# Patient Record
Sex: Female | Born: 1970 | Race: Black or African American | Hispanic: No | Marital: Single | Smoking: Current every day smoker
Health system: Southern US, Community
[De-identification: ages and names within clinical notes are randomized; demographics above are authoritative.]

## PROBLEM LIST (undated history)

## (undated) DIAGNOSIS — I10 Essential (primary) hypertension: Secondary | ICD-10-CM

## (undated) DIAGNOSIS — E7800 Pure hypercholesterolemia, unspecified: Secondary | ICD-10-CM

---

## 2018-05-13 ENCOUNTER — Encounter (HOSPITAL_COMMUNITY): Payer: Self-pay | Admitting: *Deleted

## 2018-05-13 ENCOUNTER — Other Ambulatory Visit: Payer: Self-pay

## 2018-05-13 ENCOUNTER — Emergency Department (HOSPITAL_COMMUNITY)
Admission: EM | Admit: 2018-05-13 | Discharge: 2018-05-13 | Disposition: A | Discharge status: Home / Self Care | Payer: Self-pay | Attending: Emergency Medicine | Admitting: Emergency Medicine

## 2018-05-13 DIAGNOSIS — Y999 Unspecified external cause status: Secondary | ICD-10-CM | POA: Insufficient documentation

## 2018-05-13 DIAGNOSIS — I10 Essential (primary) hypertension: Secondary | ICD-10-CM | POA: Insufficient documentation

## 2018-05-13 DIAGNOSIS — F17200 Nicotine dependence, unspecified, uncomplicated: Secondary | ICD-10-CM | POA: Insufficient documentation

## 2018-05-13 DIAGNOSIS — Y939 Activity, unspecified: Secondary | ICD-10-CM | POA: Insufficient documentation

## 2018-05-13 DIAGNOSIS — Y929 Unspecified place or not applicable: Secondary | ICD-10-CM | POA: Insufficient documentation

## 2018-05-13 DIAGNOSIS — T161XXA Foreign body in right ear, initial encounter: Secondary | ICD-10-CM | POA: Insufficient documentation

## 2018-05-13 DIAGNOSIS — W228XXA Striking against or struck by other objects, initial encounter: Secondary | ICD-10-CM | POA: Insufficient documentation

## 2018-05-13 HISTORY — DX: Pure hypercholesterolemia, unspecified: E78.00

## 2018-05-13 HISTORY — DX: Essential (primary) hypertension: I10

## 2018-05-13 NOTE — ED Triage Notes (Signed)
Pt reports cleaning her ears this am and cotton came off qtip in right ear.

## 2018-05-13 NOTE — ED Provider Notes (Signed)
[facility] Note   CSN: [id number] Arrival date & time: 05/13/18  1216     History   Chief Complaint Chief Complaint  Patient presents with  . Foreign Body in Ear    HPI Kristine Bradshaw is a 47 y.o. female who presents to the ED with c/o foreign body in the ear. Patient reports cleaning her ears with a Q-tip this morning and the cotton tip came off in her ear.   HPI  Past Medical History:  Diagnosis Date  . High cholesterol   . Hypertension     There are no active problems to display for this patient.   History reviewed. No pertinent surgical history.   OB History   None      Home Medications    Prior to Admission medications   Not on File    Family History History reviewed. No pertinent family history.  Social History Social History   Tobacco Use  . Smoking status: Current Every Day Smoker  Substance Use Topics  . Alcohol use: Yes    Comment: occ  . Drug use: Never     Allergies   Patient has no known allergies.   Review of Systems Review of Systems  HENT:       Foreign body right ear     Physical Exam Updated Vital Signs BP (!) 157/102 (BP Location: Right Arm)   Pulse 68   Temp 98.6 F (37 C) (Oral)   Resp 18   LMP 04/06/2018   SpO2 99%   Physical Exam  Constitutional: She appears well-developed and well-nourished. No distress.  HENT:  Head: Normocephalic.  Right Ear: A foreign body is present. Tympanic membrane is not perforated.  Eyes: EOM are normal.  Neck: Neck supple.  Cardiovascular: Normal rate.  Pulmonary/Chest: Effort normal.  Musculoskeletal: Normal range of motion.  Neurological: She is alert.  Skin: Skin is warm and dry.  Psychiatric: She has a normal mood and affect.  Nursing note and vitals reviewed.    ED Treatments / Results  Labs (all labs ordered are listed, but only abnormal results are displayed) Labs Reviewed - No data to display  Radiology No  results found.  Procedures .Foreign Body Removal Date/Time: 05/13/2018 12:47 PM Performed by: Janne NapoleonNeese, Erhardt Dada M, NP Authorized by: Janne NapoleonNeese, Cristie Mckinney M, NP  Consent: Verbal consent obtained. Risks and benefits: risks, benefits and alternatives were discussed Consent given by: patient Patient understanding: patient states understanding of the procedure being performed Required items: required blood products, implants, devices, and special equipment available Patient identity confirmed: verbally with patient Body area: ear Location details: right ear  Sedation: Patient sedated: no  Removal mechanism: alligator forceps Complexity: simple 1 objects recovered. Objects recovered: cotton Post-procedure assessment: foreign body removed Patient tolerance: Patient tolerated the procedure well with no immediate complications   (including critical care time)  Medications Ordered in ED Medications - No data to display   Initial Impression / Assessment and Plan / ED Course  I have reviewed the triage vital signs and the nursing notes. 47 y.o. female here with foreign body to right ear stable for d/c after removal. Re examined and TM normal.   Final Clinical Impressions(s) / ED Diagnoses   Final diagnoses:  Foreign body of right ear, initial encounter    ED Discharge Orders    None       [name], Orvis Stann HubbardM, NP 05/13/18 1249    Sabas SousBero, Michael M, MD 05/13/18 2313

## 2020-10-06 DIAGNOSIS — E7800 Pure hypercholesterolemia, unspecified: Secondary | ICD-10-CM | POA: Insufficient documentation

## 2020-10-06 DIAGNOSIS — I10 Essential (primary) hypertension: Secondary | ICD-10-CM | POA: Insufficient documentation

## 2020-10-07 DIAGNOSIS — F3342 Major depressive disorder, recurrent, in full remission: Secondary | ICD-10-CM | POA: Insufficient documentation

## 2020-10-07 DIAGNOSIS — J3089 Other allergic rhinitis: Secondary | ICD-10-CM | POA: Insufficient documentation

## 2020-10-08 DIAGNOSIS — E559 Vitamin D deficiency, unspecified: Secondary | ICD-10-CM | POA: Insufficient documentation

## 2020-12-01 ENCOUNTER — Other Ambulatory Visit: Payer: Self-pay | Admitting: Gastroenterology

## 2020-12-01 DIAGNOSIS — B181 Chronic viral hepatitis B without delta-agent: Secondary | ICD-10-CM

## 2020-12-21 ENCOUNTER — Other Ambulatory Visit: Payer: Self-pay

## 2021-01-11 DIAGNOSIS — B181 Chronic viral hepatitis B without delta-agent: Secondary | ICD-10-CM | POA: Insufficient documentation

## 2021-04-14 ENCOUNTER — Other Ambulatory Visit: Payer: Self-pay | Admitting: Family Medicine

## 2021-04-14 DIAGNOSIS — B169 Acute hepatitis B without delta-agent and without hepatic coma: Secondary | ICD-10-CM

## 2021-04-23 ENCOUNTER — Ambulatory Visit (HOSPITAL_COMMUNITY): Payer: 59

## 2021-04-30 ENCOUNTER — Encounter (HOSPITAL_COMMUNITY): Payer: Self-pay

## 2021-04-30 ENCOUNTER — Ambulatory Visit (HOSPITAL_COMMUNITY): Payer: 59 | Attending: Family Medicine

## 2021-06-09 DIAGNOSIS — N879 Dysplasia of cervix uteri, unspecified: Secondary | ICD-10-CM | POA: Insufficient documentation

## 2021-06-10 DIAGNOSIS — R928 Other abnormal and inconclusive findings on diagnostic imaging of breast: Secondary | ICD-10-CM | POA: Insufficient documentation

## 2021-09-09 LAB — RESULTS CONSOLE HPV: CHL HPV: NEGATIVE

## 2021-09-09 LAB — HM PAP SMEAR

## 2021-12-23 ENCOUNTER — Encounter (HOSPITAL_COMMUNITY): Payer: Self-pay | Admitting: *Deleted

## 2021-12-23 ENCOUNTER — Emergency Department (HOSPITAL_COMMUNITY): Payer: Commercial Managed Care - HMO

## 2021-12-23 ENCOUNTER — Other Ambulatory Visit: Payer: Self-pay

## 2021-12-23 ENCOUNTER — Emergency Department (HOSPITAL_COMMUNITY)
Admission: EM | Admit: 2021-12-23 | Discharge: 2021-12-23 | Disposition: A | Discharge status: Home / Self Care | Payer: Commercial Managed Care - HMO | Attending: Emergency Medicine | Admitting: Emergency Medicine

## 2021-12-23 DIAGNOSIS — R059 Cough, unspecified: Secondary | ICD-10-CM | POA: Diagnosis present

## 2021-12-23 DIAGNOSIS — I10 Essential (primary) hypertension: Secondary | ICD-10-CM | POA: Insufficient documentation

## 2021-12-23 DIAGNOSIS — Z20822 Contact with and (suspected) exposure to covid-19: Secondary | ICD-10-CM | POA: Insufficient documentation

## 2021-12-23 DIAGNOSIS — B349 Viral infection, unspecified: Secondary | ICD-10-CM | POA: Insufficient documentation

## 2021-12-23 LAB — RESP PANEL BY RT-PCR (FLU A&B, COVID) ARPGX2
Influenza A by PCR: NEGATIVE
Influenza B by PCR: NEGATIVE
SARS Coronavirus 2 by RT PCR: NEGATIVE

## 2021-12-23 MED ORDER — KETOROLAC TROMETHAMINE 30 MG/ML IJ SOLN
30.0000 mg | Freq: Once | INTRAMUSCULAR | Status: AC
  Administered 2021-12-23: 30 mg via INTRAVENOUS
  Filled 2021-12-23: qty 1

## 2021-12-23 NOTE — ED Triage Notes (Signed)
BIB EMS, flu symptoms last night, chills, fever, Cough,this morning shob, headache, loss of appetite. Syncopal episode witnessed by EMS approx 20 sec, 99.3 T CBG 108 144/90- 965 2 L HR 70 #20 L AC 650 mg Tylenol given ?

## 2021-12-23 NOTE — ED Notes (Signed)
Pt states understanding of dc instructions, importance of follow up.  Pt denies questions or concerns upon dc. Pt declined wheelchair assistance upon dc. Pt ambulated out of ed w/ steady gait. No belongings left in room upon dc.  

## 2021-12-23 NOTE — ED Provider Notes (Signed)
?[facility]-EMERGENCY DEPT ?Provider Note ? ? ?CSN: [id number] ?Arrival date & time: 12/23/21  0908 ? ?  ? ?History ? ?Chief Complaint  ?Patient presents with  ? flu symptoms  ? Fever  ? Shortness of Breath  ? ? ?Kristine Bradshaw is a 51 y.o. female presenting to emergency department with multiple symptoms.  She reports she began having a cough, sore throat, fevers and chills, headache, backache, loss of appetite yesterday night.  She has been around her [place] who has been sick with viral URI.  She reports he has a history of high blood pressure and high cholesterol but no other medical issues, no diabetes, no underlying pulmonary issues. ? ?HPI ? ?  ? ?Home Medications ?Prior to Admission medications   ?Not on File  ?   ? ?Allergies    ?Patient has no known allergies.   ? ?Review of Systems   ?Review of Systems ? ?Physical Exam ?Updated Vital Signs ?BP (!) 128/99 (BP Location: Right Arm)   Pulse 66   Temp 98.9 ?F (37.2 ?C) (Oral)   Resp 19   Ht 5\' 10"  (1.778 m)   Wt 96.6 kg   SpO2 99%   BMI 30.56 kg/m?  ?Physical Exam ?Constitutional:   ?   General: She is not in acute distress. ?HENT:  ?   Head: Normocephalic and atraumatic.  ?Eyes:  ?   Conjunctiva/sclera: Conjunctivae normal.  ?   Pupils: Pupils are equal, round, and reactive to light.  ?Cardiovascular:  ?   Rate and Rhythm: Normal rate and regular rhythm.  ?Pulmonary:  ?   Effort: Pulmonary effort is normal. No respiratory distress.  ?Abdominal:  ?   General: There is no distension.  ?   Tenderness: There is no abdominal tenderness.  ?Skin: ?   General: Skin is warm and dry.  ?Neurological:  ?   General: No focal deficit present.  ?   Mental Status: She is alert. Mental status is at baseline.  ?Psychiatric:     ?   Mood and Affect: Mood normal.     ?   Behavior: Behavior normal.  ? ? ?ED Results / Procedures / Treatments   ?Labs ?(all labs ordered are listed, but only abnormal results are displayed) ?Labs Reviewed  ?RESP PANEL BY  RT-PCR (FLU A&B, COVID) ARPGX2  ? ? ?EKG ?None ? ?Radiology ?DG Chest 2 View ? ?Result Date: 12/23/2021 ?CLINICAL DATA:  Flu like symptoms since last night. EXAM: CHEST - 2 VIEW COMPARISON:  None Available. FINDINGS: The heart size and mediastinal contours are within normal limits. Both lungs are clear. The visualized skeletal structures are unremarkable. IMPRESSION: No active cardiopulmonary disease. Electronically Signed   By: 02/22/2022 M.D.   On: 12/23/2021 10:13   ? ?Procedures ?Procedures  ? ? ?Medications Ordered in ED ?Medications - No data to display ? ?ED Course/ Medical Decision Making/ A&P ?  ?                        ?Medical Decision Making ?Amount and/or Complexity of Data Reviewed ?Radiology: ordered. ? ? ?Patient is here with suspected viral syndrome beginning yesterday.  She appears stable on arrival.  Does not meet SIRS criteria and I doubt she is septic.  X-ray of the chest was ordered and personally reviewed and interpreted showing no focal infiltrate.  Her EKG shows a sinus rhythm no acute ischemic findings.  We will send a COVID and flu  test, and discharge her home after some IV Toradol for her headache and back pain.  We discussed quarantine measures if her test returns positive.  She will follow-up on the results online.  She verbalized understanding. ? ?I have a low suspicion for meningitis or bacteremia at this time, and I do not believe he needs an emergent LP. ? ?Kristine Bradshaw was evaluated in Emergency Department on 12/23/2021 for the symptoms described in the history of present illness. She was evaluated in the context of the global COVID-19 pandemic, which necessitated consideration that the patient might be at risk for infection with the SARS-CoV-2 virus that causes COVID-19. Institutional protocols and algorithms that pertain to the evaluation of patients at risk for COVID-19 are in a state of rapid change based on information released by regulatory bodies including the CDC and  federal and state organizations. These policies and algorithms were followed during the patient's care in the ED. ? ? ? ? ? ? ? ? ?Final Clinical Impression(s) / ED Diagnoses ?Final diagnoses:  ?Viral illness  ? ? ?Rx / DC Orders ?ED Discharge Orders   ? ? None  ? ?  ? ? ?  ?Terald Sleeper, MD ?12/23/21 1029 ? ?

## 2021-12-23 NOTE — Discharge Instructions (Addendum)
Please follow-up on your COVID and flu test results this afternoon on your MyChart.  If you test positive for either of these viruses, he should quarantine for 10 days from the start of your symptoms.  You are most contagious in the next 5 days.  Please wear a mask if you go outside, remember to wash your hands, and try to avoid directly touching people. ? ?You can use over-the-counter medications for cold and flu to manage your symptoms.  Most viruses last about 3 to 5 days. ?

## 2022-01-10 ENCOUNTER — Encounter: Payer: Self-pay | Admitting: Critical Care Medicine

## 2022-01-10 NOTE — Progress Notes (Incomplete)
   New Patient Office Visit  Subjective    Patient ID: Kristine Bradshaw, female    DOB: [birth year]  Age: 51 y.o. MRN: [id number]  CC: No chief complaint on file.   HPI Kristine Bradshaw presents to establish care Colon hcv pap hiv  No outpatient encounter medications on file as of 01/11/2022.   No facility-administered encounter medications on file as of 01/11/2022.    Past Medical History:  Diagnosis Date  . High cholesterol   . Hypertension     No past surgical history on file.  No family history on file.  Social History   Socioeconomic History  . Marital status: Single    Spouse name: Not on file  . Number of children: Not on file  . Years of education: Not on file  . Highest education level: Not on file  Occupational History  . Not on file  Tobacco Use  . Smoking status: Every Day  . Smokeless tobacco: Not on file  Vaping Use  . Vaping Use: Never used  Substance and Sexual Activity  . Alcohol use: Yes    Comment: occ  . Drug use: Never  . Sexual activity: Not on file  Other Topics Concern  . Not on file  Social History Narrative  . Not on file   Social Determinants of Health   Financial Resource Strain: Not on file  Food Insecurity: Not on file  Transportation Needs: Not on file  Physical Activity: Not on file  Stress: Not on file  Social Connections: Not on file  Intimate Partner Violence: Not on file    ROS      Objective    There were no vitals taken for this visit.  Physical Exam  {Labs (Optional):23779}    Assessment & Plan:   Problem List Items Addressed This Visit   None   No follow-ups on file.   Asencion Noble, MD

## 2022-01-11 ENCOUNTER — Ambulatory Visit: Payer: 59 | Admitting: Critical Care Medicine

## 2022-07-25 ENCOUNTER — Other Ambulatory Visit: Payer: Self-pay

## 2022-07-25 ENCOUNTER — Emergency Department (HOSPITAL_COMMUNITY)
Admission: EM | Admit: 2022-07-25 | Discharge: 2022-07-25 | Disposition: A | Discharge status: Home / Self Care | Payer: Commercial Managed Care - HMO | Attending: Emergency Medicine | Admitting: Emergency Medicine

## 2022-07-25 DIAGNOSIS — Z79899 Other long term (current) drug therapy: Secondary | ICD-10-CM | POA: Diagnosis not present

## 2022-07-25 DIAGNOSIS — R630 Anorexia: Secondary | ICD-10-CM | POA: Diagnosis not present

## 2022-07-25 DIAGNOSIS — J101 Influenza due to other identified influenza virus with other respiratory manifestations: Secondary | ICD-10-CM | POA: Insufficient documentation

## 2022-07-25 DIAGNOSIS — R5383 Other fatigue: Secondary | ICD-10-CM | POA: Diagnosis present

## 2022-07-25 DIAGNOSIS — Z1152 Encounter for screening for COVID-19: Secondary | ICD-10-CM | POA: Diagnosis not present

## 2022-07-25 LAB — RESP PANEL BY RT-PCR (FLU A&B, COVID) ARPGX2
Influenza A by PCR: POSITIVE — AB
Influenza B by PCR: NEGATIVE
SARS Coronavirus 2 by RT PCR: NEGATIVE

## 2022-07-25 MED ORDER — IBUPROFEN 200 MG PO TABS
600.0000 mg | ORAL_TABLET | Freq: Once | ORAL | Status: AC
  Administered 2022-07-25: 600 mg via ORAL
  Filled 2022-07-25: qty 3

## 2022-07-25 NOTE — Discharge Instructions (Addendum)
Evaluation for you fatigue and generalized bodyaches revealed that you do have the flu.  Recommend conservative treatment at home which includes rest, hydration and advance your diet as tolerated.  Recommend Tylenol and ibuprofen intermittently for symptoms related to the flu.  Recommend you follow-up with your PCP if symptoms persist.  If you have new altered mental status or visual disturbance with fever along with neck stiffness please return to the emergency department for further evaluation.

## 2022-07-25 NOTE — ED Triage Notes (Signed)
Pt reports fatigue, nausea, and headache that started Saturday night. Denies fevers.

## 2022-07-25 NOTE — ED Provider Notes (Signed)
[facility]-EMERGENCY DEPT Provider Note   CSN: [id number] Arrival date & time: 07/25/22  7215     History  Chief Complaint  Patient presents with   Fatigue   HPI Kristine Bradshaw is a 51 y.o. female with MDD, vitamin D deficiency, and high cholesterol for fatigue which started yesterday.  Also endorsing generalized bodyaches and poor appetite.  Denies fever.  She has been taking Tylenol and naproxen at home.  Denies nuchal rigidity.  Mentioned that she did have a mild headache since yesterday as well.  Is not the worst headache of her life.  Denies sick contacts.  Denies vomiting, nausea and diarrhea.  HPI     Home Medications Prior to Admission medications   Medication Sig Start Date End Date Taking? Authorizing Provider  amLODipine (NORVASC) 10 MG tablet Take 10 mg by mouth daily. 12/21/21   [provider]  atorvastatin (LIPITOR) 10 MG tablet Take 10 mg by mouth at bedtime. 12/21/21   [provider]  buPROPion ER [facility] SR) 100 MG 12 hr tablet Take by mouth. 06/03/21   [provider]  CLARITIN 10 MG tablet Take 10 mg by mouth daily. 12/21/21   [provider]  cyclobenzaprine (FLEXERIL) 10 MG tablet Take 10 mg by mouth at bedtime as needed. 12/21/21   [provider]  diclofenac Sodium (VOLTAREN) 1 % GEL Apply topically. 07/30/21   [provider]  hydrochlorothiazide (HYDRODIURIL) 25 MG tablet Take 25 mg by mouth daily. 12/21/21   [provider]  losartan (COZAAR) 50 MG tablet Take 50 mg by mouth daily. 12/07/21   [provider]  meloxicam (MOBIC) 15 MG tablet Take 15 mg by mouth daily as needed. 12/21/21   [provider]  MIRALAX 17 GM/SCOOP powder SMARTSIG:1 scoopful By Mouth Every Night PRN 11/03/21   [provider]  SUMAtriptan (IMITREX) 50 MG tablet Take by mouth. 04/14/21   [provider]      Allergies    Patient has no known allergies.    Review of  Systems   Review of Systems  Constitutional:  Positive for fatigue.    Physical Exam Updated Vital Signs BP (!) 123/94 (BP Location: Left Arm)   Pulse 79   Temp 99 F (37.2 C) (Oral)   Resp 18   SpO2 97%  Physical Exam Vitals and nursing note reviewed.  HENT:     Head: Normocephalic and atraumatic.     Mouth/Throat:     Mouth: Mucous membranes are moist.  Eyes:     General:        Right eye: No discharge.        Left eye: No discharge.     Conjunctiva/sclera: Conjunctivae normal.  Cardiovascular:     Rate and Rhythm: Normal rate and regular rhythm.     Pulses: Normal pulses.     Heart sounds: Normal heart sounds.  Pulmonary:     Effort: Pulmonary effort is normal.     Breath sounds: Normal breath sounds.  Abdominal:     General: Abdomen is flat.     Palpations: Abdomen is soft.  Skin:    General: Skin is warm and dry.  Neurological:     General: No focal deficit present.     Comments: GCS 15. Speech is goal oriented. No deficits appreciated to CN III-XII; symmetric eyebrow raise, no facial drooping, tongue midline. Patient has equal grip strength bilaterally with 5/5 strength against resistance in all major muscle groups  bilaterally. Sensation to light touch intact. Patient moves extremities without ataxia. Normal finger-nose-finger. Patient ambulatory with steady gait.   Psychiatric:        Mood and Affect: Mood normal.     ED Results / Procedures / Treatments   Labs (all labs ordered are listed, but only abnormal results are displayed) Labs Reviewed  RESP PANEL BY RT-PCR (FLU A&B, COVID) ARPGX2 - Abnormal; Notable for the following components:      Result Value   Influenza A by PCR POSITIVE (*)    All other components within normal limits    EKG None  Radiology No results found.  Procedures Procedures    Medications Ordered in ED Medications - No data to display  ED Course/ Medical Decision Making/ A&P                           Medical Decision  Making  Patient presenting for fatigue, generalized bodyaches and poor appetite.  Differential diagnosis for this complaint includes flu, meningitis, COVID.  Physical exam unremarkable. Considered meningitis but unlikely given no nuchal rigidity and no visual disturbance.  COVID but unlikely given negative PCR.  Symptoms are consistent with flu which was confirmed by positive PCR.  Offered Tamiflu for treatment but patient refused stating she did not want to the risk of possible vomiting associated with the medicine.  Recommended conservative treatment at home.  Discussed return precautions and advised her to follow-up with PCP if her symptoms persisted.  Treated headache and bodyaches with ibuprofen.        Final Clinical Impression(s) / ED Diagnoses Final diagnoses:  Influenza A    Rx / DC Orders ED Discharge Orders     None         Gareth Eagle, PA-C 07/25/22 1308    Benjiman Core, MD 07/25/22 1505

## 2023-02-09 IMAGING — CR DG CHEST 2V
2 series · 2 of 2 positions shown · non-contrast
Comparison: None Available.

CLINICAL DATA: Flu like symptoms since last night.

EXAM:
CHEST - 2 VIEW

[w chest pa]
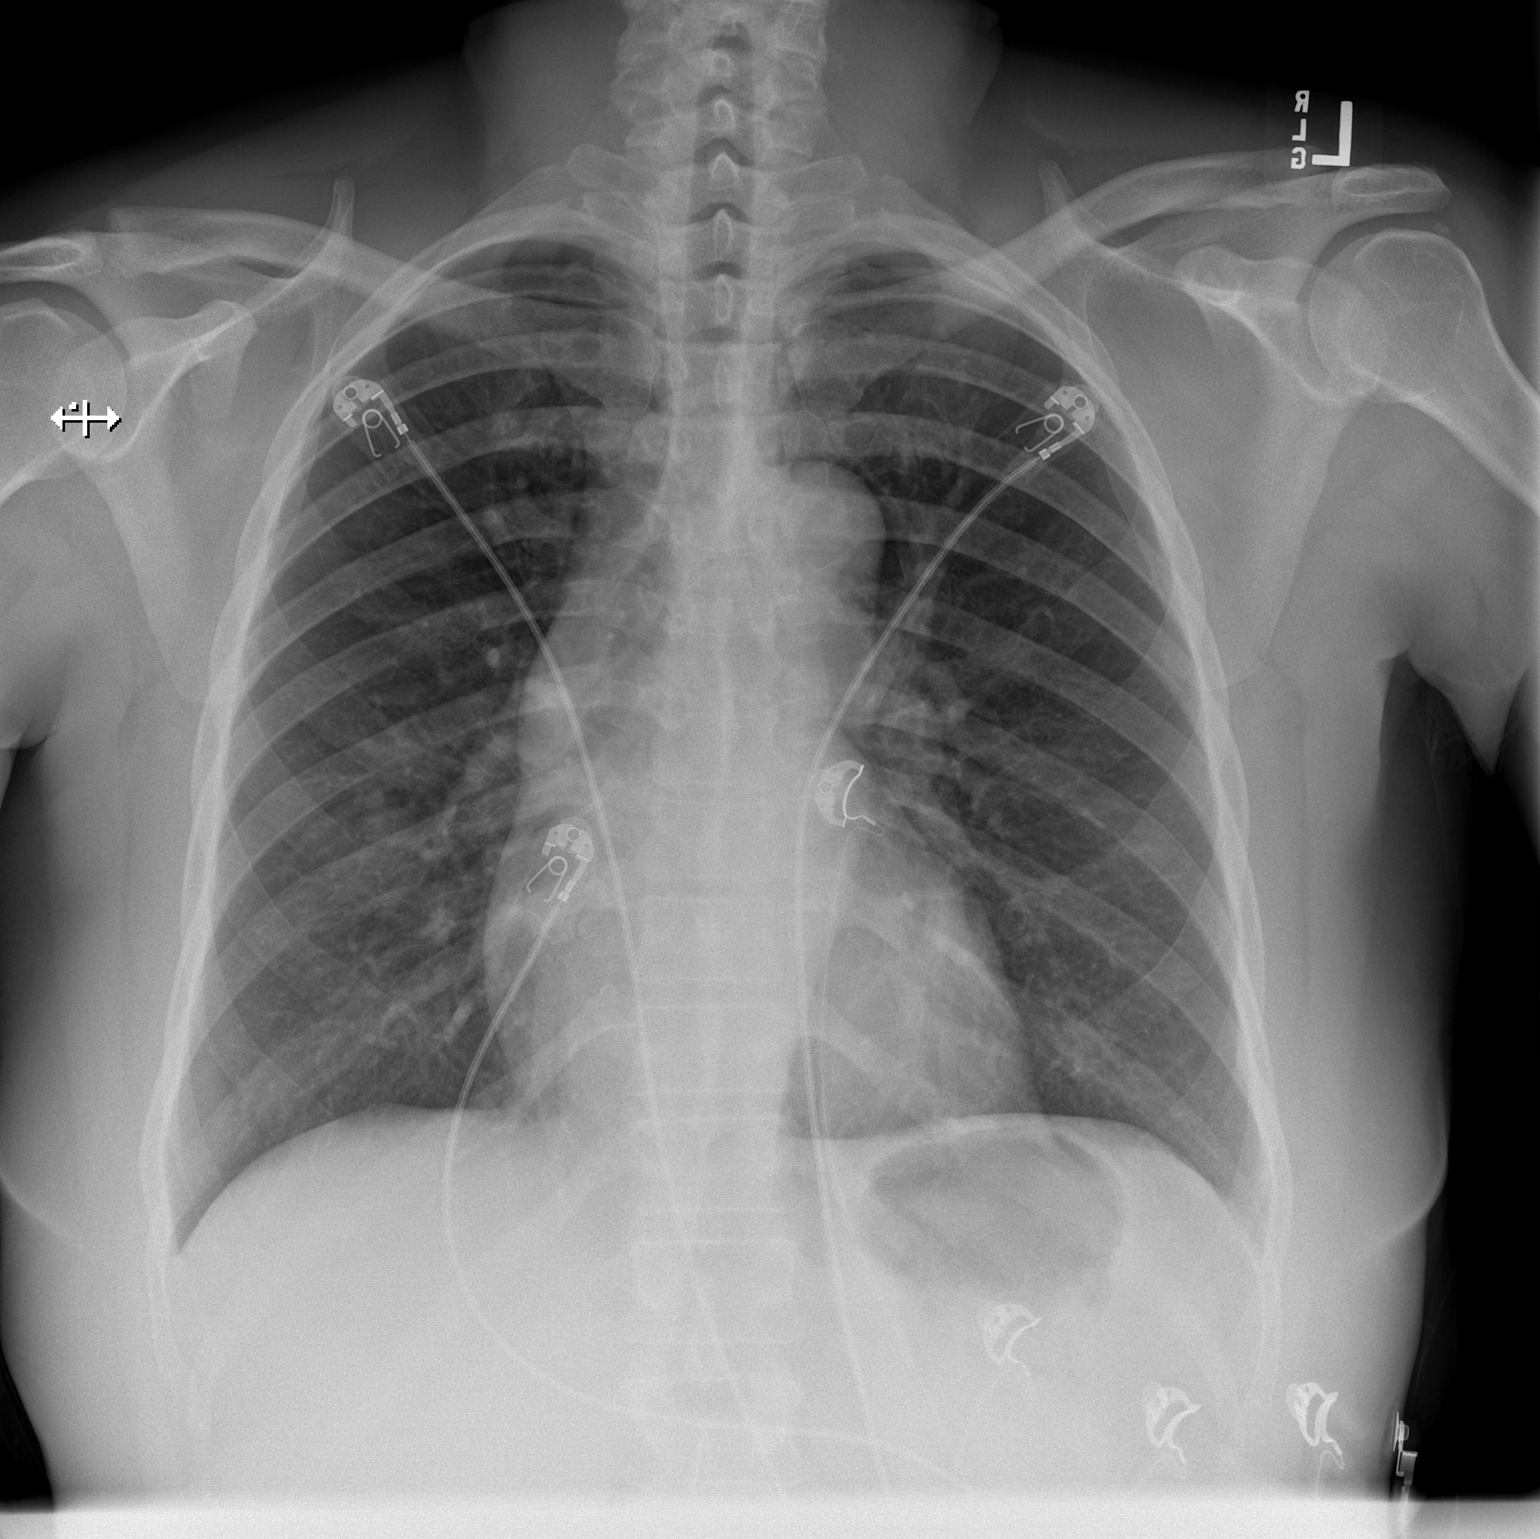

[w chest lat]
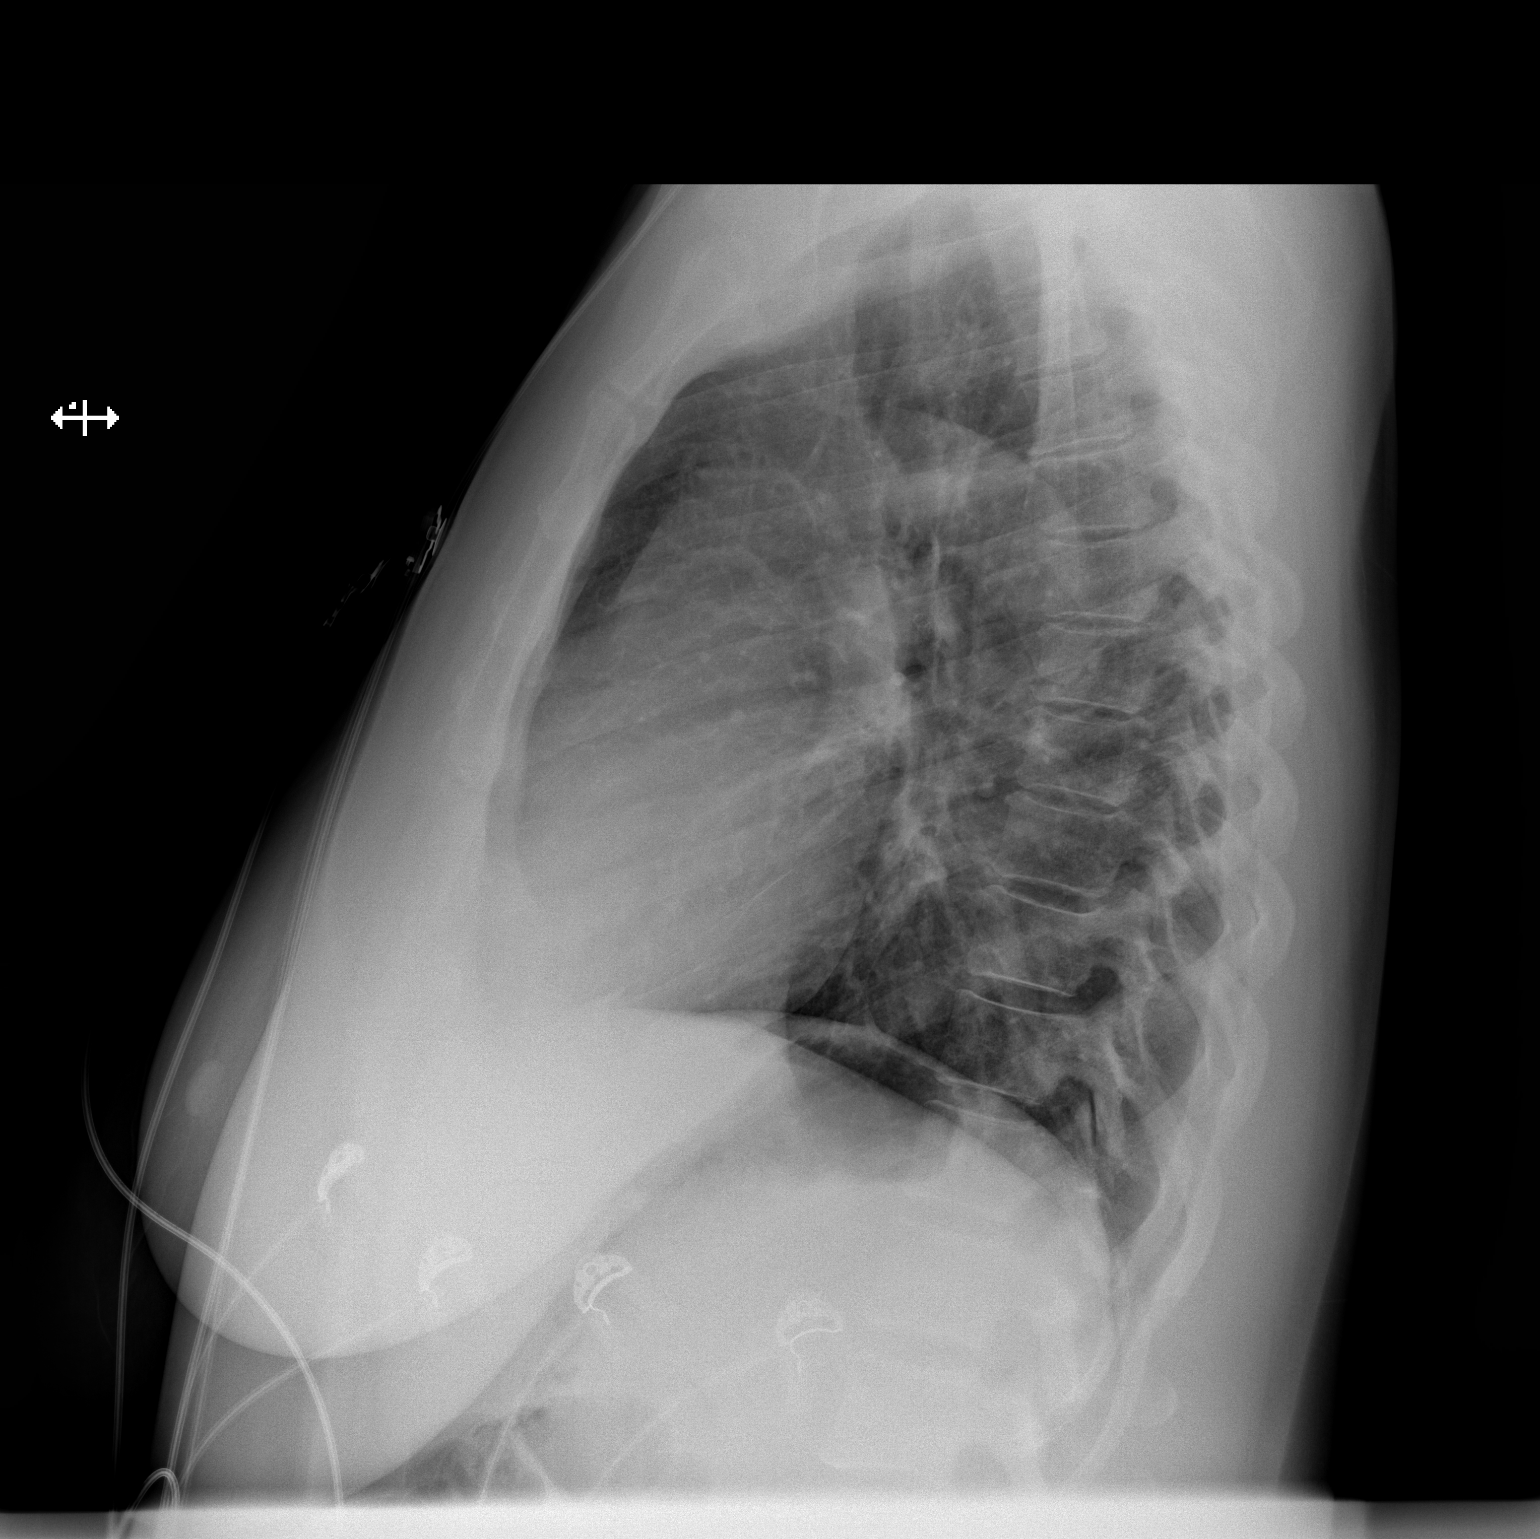

[2 of 2 positions shown; findings below may reference images not displayed]

FINDINGS: The heart size and mediastinal contours are within normal limits.
Both lungs are clear. The visualized skeletal structures are
unremarkable.
IMPRESSION: No active cardiopulmonary disease.

## 2024-02-20 DIAGNOSIS — Z760 Encounter for issue of repeat prescription: Secondary | ICD-10-CM | POA: Diagnosis not present

## 2024-03-14 ENCOUNTER — Other Ambulatory Visit: Payer: Self-pay | Admitting: Family Medicine

## 2024-03-14 ENCOUNTER — Ambulatory Visit: Admitting: Family Medicine

## 2024-03-14 ENCOUNTER — Encounter: Payer: Self-pay | Admitting: Family Medicine

## 2024-03-14 VITALS — BP 104/76 | HR 75 | Temp 97.8°F | Ht 70.0 in | Wt 236.0 lb

## 2024-03-14 DIAGNOSIS — E66811 Obesity, class 1: Secondary | ICD-10-CM

## 2024-03-14 DIAGNOSIS — Z01419 Encounter for gynecological examination (general) (routine) without abnormal findings: Secondary | ICD-10-CM

## 2024-03-14 DIAGNOSIS — E559 Vitamin D deficiency, unspecified: Secondary | ICD-10-CM

## 2024-03-14 DIAGNOSIS — E785 Hyperlipidemia, unspecified: Secondary | ICD-10-CM | POA: Diagnosis not present

## 2024-03-14 DIAGNOSIS — I10 Essential (primary) hypertension: Secondary | ICD-10-CM

## 2024-03-14 DIAGNOSIS — Z6833 Body mass index (BMI) 33.0-33.9, adult: Secondary | ICD-10-CM

## 2024-03-14 DIAGNOSIS — F17200 Nicotine dependence, unspecified, uncomplicated: Secondary | ICD-10-CM | POA: Diagnosis not present

## 2024-03-14 LAB — COMPREHENSIVE METABOLIC PANEL WITH GFR
ALT: 29 U/L (ref 0–35)
AST: 21 U/L (ref 0–37)
Albumin: 4.5 g/dL (ref 3.5–5.2)
Alkaline Phosphatase: 85 U/L (ref 39–117)
BUN: 12 mg/dL (ref 6–23)
CO2: 29 meq/L (ref 19–32)
Calcium: 9.6 mg/dL (ref 8.4–10.5)
Chloride: 102 meq/L (ref 96–112)
Creatinine, Ser: 0.68 mg/dL (ref 0.40–1.20)
GFR: 100.38 mL/min (ref >60.00)
Glucose, Bld: 98 mg/dL (ref 70–99)
Potassium: 3.2 meq/L — ABNORMAL LOW (ref 3.5–5.1)
Sodium: 139 meq/L (ref 135–145)
Total Bilirubin: 0.4 mg/dL (ref 0.2–1.2)
Total Protein: 7.6 g/dL (ref 6.0–8.3)

## 2024-03-14 LAB — LIPID PANEL
Cholesterol: 221 mg/dL — ABNORMAL HIGH (ref 0–200)
HDL: 64.6 mg/dL (ref >39.00)
LDL Cholesterol: 111 mg/dL — ABNORMAL HIGH (ref 0–99)
NonHDL: 156.07
Total CHOL/HDL Ratio: 3
Triglycerides: 226 mg/dL — ABNORMAL HIGH (ref 0.0–149.0)
VLDL: 45.2 mg/dL — ABNORMAL HIGH (ref 0.0–40.0)

## 2024-03-14 LAB — CBC WITH DIFFERENTIAL/PLATELET
Basophils Absolute: 0 K/uL (ref 0.0–0.1)
Basophils Relative: 0.5 % (ref 0.0–3.0)
Eosinophils Absolute: 0.1 K/uL (ref 0.0–0.7)
Eosinophils Relative: 1.8 % (ref 0.0–5.0)
HCT: 40.8 % (ref 36.0–46.0)
Hemoglobin: 13.7 g/dL (ref 12.0–15.0)
Lymphocytes Relative: 33.9 % (ref 12.0–46.0)
Lymphs Abs: 2.3 K/uL (ref 0.7–4.0)
MCHC: 33.6 g/dL (ref 30.0–36.0)
MCV: 92.1 fl (ref 78.0–100.0)
Monocytes Absolute: 0.6 K/uL (ref 0.1–1.0)
Monocytes Relative: 9.5 % (ref 3.0–12.0)
Neutro Abs: 3.7 K/uL (ref 1.4–7.7)
Neutrophils Relative %: 54.3 % (ref 43.0–77.0)
Platelets: 251 K/uL (ref 150.0–400.0)
RBC: 4.43 Mil/uL (ref 3.87–5.11)
RDW: 14.1 % (ref 11.5–15.5)
WBC: 6.8 K/uL (ref 4.0–10.5)

## 2024-03-14 LAB — VITAMIN D 25 HYDROXY (VIT D DEFICIENCY, FRACTURES): VITD: 13.04 ng/mL — ABNORMAL LOW (ref 30.00–100.00)

## 2024-03-14 LAB — TSH: TSH: 1.47 u[IU]/mL (ref 0.35–5.50)

## 2024-03-14 LAB — HEMOGLOBIN A1C: Hgb A1c MFr Bld: 5.9 % (ref 4.6–6.5)

## 2024-03-14 LAB — T4, FREE: Free T4: 0.68 ng/dL (ref 0.60–1.60)

## 2024-03-14 MED ORDER — NAPROXEN 500 MG PO TABS
500.0000 mg | ORAL_TABLET | Freq: Every day | ORAL | 0 refills | Status: DC | PRN
Start: 2024-03-14 — End: 2024-04-24

## 2024-03-14 MED ORDER — HYDROCHLOROTHIAZIDE 25 MG PO TABS: 25.0000 mg | ORAL_TABLET | Freq: Every day | ORAL | 0 refills | Status: DC

## 2024-03-14 MED ORDER — LOSARTAN POTASSIUM 50 MG PO TABS
50.0000 mg | ORAL_TABLET | Freq: Every day | ORAL | 0 refills | Status: DC
Start: 2024-03-14 — End: 2024-04-24

## 2024-03-14 MED ORDER — AMLODIPINE BESYLATE 10 MG PO TABS
10.0000 mg | ORAL_TABLET | Freq: Every day | ORAL | 0 refills | Status: DC
Start: 2024-03-14 — End: 2024-04-24

## 2024-03-14 NOTE — Progress Notes (Signed)
 New Patient Office Visit  Subjective    Patient ID: Kristine Bradshaw, female    DOB: 1971-08-10  Age: 53 y.o. MRN: [id number]  CC:  Chief Complaint  Patient presents with   Establish Care    Needs BP pills    HPI Kristine Bradshaw presents to establish care Previous PCP- [facility]   HTN - ran out blood pressure medications today Reports good compliance and doing well on her current medications.  HLD reports taking cholesterol medicine daily-  Headaches -takes naproxen  as needed   LMP: June, regular  Overdue for pelvic exam, ?pap smear   Smokes 1ppd x 14 years   Denies drinking  Single  3 kids, grandchildren  Works at [employer] Medications as of 03/14/2024  Medication Sig   atorvastatin (LIPITOR) 10 MG tablet Take 10 mg by mouth at bedtime.   buPROPion ER (WELLBUTRIN SR) 100 MG 12 hr tablet Take by mouth.   CLARITIN 10 MG tablet Take 10 mg by mouth daily.   cyclobenzaprine (FLEXERIL) 10 MG tablet Take 10 mg by mouth at bedtime as needed.   MIRALAX 17 GM/SCOOP powder SMARTSIG:1 scoopful By Mouth Every Night PRN   naproxen  (NAPROSYN ) 500 MG tablet Take 1 tablet (500 mg total) by mouth daily as needed.   SUMAtriptan (IMITREX) 50 MG tablet Take by mouth.   [DISCONTINUED] diclofenac Sodium (VOLTAREN) 1 % GEL Apply topically.   [DISCONTINUED] hydrochlorothiazide  (HYDRODIURIL ) 25 MG tablet Take 25 mg by mouth daily.   [DISCONTINUED] meloxicam (MOBIC) 15 MG tablet Take 15 mg by mouth daily as needed.   amLODipine  (NORVASC ) 10 MG tablet Take 1 tablet (10 mg total) by mouth daily.   hydrochlorothiazide  (HYDRODIURIL ) 25 MG tablet Take 1 tablet (25 mg total) by mouth daily.   losartan  (COZAAR ) 50 MG tablet Take 1 tablet (50 mg total) by mouth daily.   [DISCONTINUED] amLODipine  (NORVASC ) 10 MG tablet Take 10 mg by mouth daily.   [DISCONTINUED] losartan  (COZAAR ) 50 MG tablet Take 50 mg by mouth daily.   No facility-administered  encounter medications on file as of 03/14/2024.    Past Medical History:  Diagnosis Date   High cholesterol    Hypertension     History reviewed. No pertinent surgical history.  History reviewed. No pertinent family history.  Social History   Socioeconomic History   Marital status: Single    Spouse name: Not on file   Number of children: Not on file   Years of education: Not on file   Highest education level: Not on file  Occupational History   Not on file  Tobacco Use   Smoking status: Every Day   Smokeless tobacco: Not on file  Vaping Use   Vaping status: Never Used  Substance and Sexual Activity   Alcohol use: Yes    Comment: occ   Drug use: Never   Sexual activity: Not on file  Other Topics Concern   Not on file  Social History Narrative   Not on file   Social Drivers of Health   Financial Resource Strain: Not on file  Food Insecurity: Not on file  Transportation Needs: Not on file  Physical Activity: Not on file  Stress: Not on file  Social Connections: Not on file  Intimate Partner Violence: Low Risk  (08/27/2020)   Received from [facility]   Intimate Partner Violence    Insults You: Not on file    Threatens You: Not on file  Screams at You: Not on file    Physically Hurt: Not on file    Intimate Partner Violence Score: Not on file    Review of Systems  Constitutional:  Negative for chills, fever, malaise/fatigue and weight loss.  Eyes:  Negative for blurred vision and double vision.  Respiratory:  Negative for shortness of breath.   Cardiovascular:  Negative for chest pain, palpitations and leg swelling.  Gastrointestinal:  Negative for abdominal pain, constipation, diarrhea, nausea and vomiting.  Genitourinary:  Negative for dysuria, frequency and urgency.  Neurological:  Negative for dizziness, focal weakness and headaches.  Psychiatric/Behavioral:  Negative for depression. The patient is not nervous/anxious.         Objective    BP  104/76   Pulse 75   Temp 97.8 F (36.6 C) (Temporal)   Ht 5' 10 (1.778 m)   Wt 236 lb (107 kg)   SpO2 98%   BMI 33.86 kg/m   Physical Exam Constitutional:      General: She is not in acute distress.    Appearance: She is not ill-appearing.  Eyes:     Extraocular Movements: Extraocular movements intact.     Conjunctiva/sclera: Conjunctivae normal.  Cardiovascular:     Rate and Rhythm: Normal rate and regular rhythm.  Pulmonary:     Effort: Pulmonary effort is normal.     Breath sounds: Normal breath sounds.  Musculoskeletal:     Cervical back: Normal range of motion and neck supple.     Right lower leg: No edema.     Left lower leg: No edema.  Skin:    General: Skin is warm and dry.  Neurological:     General: No focal deficit present.     Mental Status: She is alert and oriented to person, place, and time.     Motor: No weakness.     Coordination: Coordination normal.     Gait: Gait normal.  Psychiatric:        Mood and Affect: Mood normal.        Behavior: Behavior normal.        Thought Content: Thought content normal.         Assessment & Plan:   Problem List Items Addressed This Visit     Primary hypertension - Primary   Relevant Medications   losartan  (COZAAR ) 50 MG tablet   amLODipine  (NORVASC ) 10 MG tablet   hydrochlorothiazide  (HYDRODIURIL ) 25 MG tablet   Other Relevant Orders   CBC with Differential/Platelet   Comprehensive metabolic panel with GFR   TSH   T4, free   Vitamin D  deficiency   Relevant Orders   VITAMIN D  25 Hydroxy (Vit-D Deficiency, Fractures)   Other Visit Diagnoses       Hyperlipidemia, unspecified hyperlipidemia type       Relevant Medications   losartan  (COZAAR ) 50 MG tablet   amLODipine  (NORVASC ) 10 MG tablet   hydrochlorothiazide  (HYDRODIURIL ) 25 MG tablet   Other Relevant Orders   Lipid panel     Obesity (BMI 30.0-34.9)       Relevant Orders   CBC with Differential/Platelet   Comprehensive metabolic panel with  GFR   Hemoglobin A1c   Lipid panel   TSH   T4, free     Encounter for breast and pelvic examination       Relevant Orders   Ambulatory referral to Gynecology     Smoker          She is a pleasant  53 year old female who is new to the practice and here to establish care.  Her 2 grandsons are with her today. Reports good compliance with medications for hypertension and hyperlipidemia.  Needs refills of her medications. Check labs including renal function, A1c, thyroid, CBC and CMP to look for  complications related to obesity and chronic health conditions. Referral to gynecology per request Reports taking naproxen  for headaches. Requests refill  She smokes and currently has no plan for stopping. Follow up in 6 weeks fasting.   Return in about 6 weeks (around 04/25/2024) for Fasting follow up.   Boby Mackintosh, NP-C

## 2024-03-14 NOTE — Telephone Encounter (Signed)
 Copied from CRM [id number]. Topic: Clinical - Medication Refill >> Mar 14, 2024  4:45 PM Leah C wrote: Medication: atorvastatin (LIPITOR) 10 MG tablet  Has the patient contacted their pharmacy? Patient went to the pharmacy and picked up her other prescriptions but she did not receive her pills for her cholesterol.   This is the patient's preferred pharmacy:  [id number] [place], [place] - [address] [place] [address] Phone: [phone] Fax: [phone]  Is this the correct pharmacy for this prescription? Yes If no, delete pharmacy and type the correct one.   Has the prescription been filled recently? No  Is the patient out of the medication? Yes  Has the patient been seen for an appointment in the last year OR does the patient have an upcoming appointment? Yes  Can we respond through MyChart? Yes  Agent: Please be advised that Rx refills may take up to 3 business days. We ask that you follow-up with your pharmacy.

## 2024-03-14 NOTE — Patient Instructions (Signed)
 Please go downstairs for labs before you leave.  We will be in touch with your results and with recommendations.  Follow-up in approximately 6 weeks fasting (nothing to eat or drink for at least 8 hours except water).  Please see the smoking cessation handout

## 2024-03-15 MED ORDER — ATORVASTATIN CALCIUM 10 MG PO TABS: 10.0000 mg | ORAL_TABLET | Freq: Every day | ORAL | 0 refills | Status: DC

## 2024-03-19 ENCOUNTER — Ambulatory Visit: Payer: Self-pay | Admitting: Family Medicine

## 2024-03-19 ENCOUNTER — Other Ambulatory Visit: Payer: Self-pay | Admitting: Family Medicine

## 2024-03-19 MED ORDER — VITAMIN D (ERGOCALCIFEROL) 1.25 MG (50000 UNIT) PO CAPS: 50000.0000 [IU] | ORAL_CAPSULE | ORAL | 1 refills | Status: AC

## 2024-03-19 MED ORDER — POTASSIUM CHLORIDE CRYS ER 20 MEQ PO TBCR: 20.0000 meq | EXTENDED_RELEASE_TABLET | Freq: Every day | ORAL | 0 refills | Status: AC

## 2024-03-21 ENCOUNTER — Other Ambulatory Visit: Payer: Self-pay | Admitting: Family Medicine

## 2024-03-27 ENCOUNTER — Encounter: Payer: Self-pay | Admitting: Family Medicine

## 2024-03-27 ENCOUNTER — Ambulatory Visit: Admitting: Family Medicine

## 2024-03-27 ENCOUNTER — Other Ambulatory Visit: Payer: Self-pay | Admitting: Family Medicine

## 2024-03-27 ENCOUNTER — Ambulatory Visit: Payer: Self-pay | Admitting: Family Medicine

## 2024-03-27 VITALS — BP 110/70 | HR 65 | Temp 97.8°F | Ht 70.0 in | Wt 236.0 lb

## 2024-03-27 DIAGNOSIS — F419 Anxiety disorder, unspecified: Secondary | ICD-10-CM | POA: Diagnosis not present

## 2024-03-27 DIAGNOSIS — E876 Hypokalemia: Secondary | ICD-10-CM | POA: Diagnosis not present

## 2024-03-27 DIAGNOSIS — R7303 Prediabetes: Secondary | ICD-10-CM

## 2024-03-27 DIAGNOSIS — I10 Essential (primary) hypertension: Secondary | ICD-10-CM | POA: Diagnosis not present

## 2024-03-27 DIAGNOSIS — T502X5A Adverse effect of carbonic-anhydrase inhibitors, benzothiadiazides and other diuretics, initial encounter: Secondary | ICD-10-CM

## 2024-03-27 DIAGNOSIS — E559 Vitamin D deficiency, unspecified: Secondary | ICD-10-CM

## 2024-03-27 DIAGNOSIS — F17200 Nicotine dependence, unspecified, uncomplicated: Secondary | ICD-10-CM | POA: Diagnosis not present

## 2024-03-27 DIAGNOSIS — E66811 Obesity, class 1: Secondary | ICD-10-CM | POA: Diagnosis not present

## 2024-03-27 DIAGNOSIS — R928 Other abnormal and inconclusive findings on diagnostic imaging of breast: Secondary | ICD-10-CM | POA: Diagnosis not present

## 2024-03-27 DIAGNOSIS — N879 Dysplasia of cervix uteri, unspecified: Secondary | ICD-10-CM | POA: Diagnosis not present

## 2024-03-27 LAB — BASIC METABOLIC PANEL WITH GFR
BUN: 11 mg/dL (ref 6–23)
CO2: 28 meq/L (ref 19–32)
Calcium: 9.4 mg/dL (ref 8.4–10.5)
Chloride: 102 meq/L (ref 96–112)
Creatinine, Ser: 0.74 mg/dL (ref 0.40–1.20)
GFR: 93.22 mL/min (ref >60.00)
Glucose, Bld: 95 mg/dL (ref 70–99)
Potassium: 3.5 meq/L (ref 3.5–5.1)
Sodium: 140 meq/L (ref 135–145)

## 2024-03-27 LAB — VITAMIN D 25 HYDROXY (VIT D DEFICIENCY, FRACTURES): VITD: 29.35 ng/mL — ABNORMAL LOW (ref 30.00–100.00)

## 2024-03-27 MED ORDER — SERTRALINE HCL 25 MG PO TABS: 25.0000 mg | ORAL_TABLET | Freq: Every day | ORAL | 2 refills | Status: DC

## 2024-03-27 MED ORDER — CLARITIN 10 MG PO TABS: 10.0000 mg | ORAL_TABLET | Freq: Every day | ORAL | 1 refills | Status: AC

## 2024-03-27 NOTE — Progress Notes (Signed)
 Please let her know that her vitamin D  is close to normal range now.  I recommend that she take over-the-counter vitamin D3 1000 IUs daily going forward.  She does not need to take any more prescription vitamin D .  Her potassium is normal now.  She actually is being overtreated for her blood pressure.  I am stopping her hydrochlorothiazide .  Ask her to stop taking this and I will see her back in 3 months as planned or sooner if she has any concerns.

## 2024-03-27 NOTE — Progress Notes (Signed)
 Subjective:     Patient ID: Kristine Bradshaw, female    DOB: [birth year], 53 y.o.   MRN: [id number]  Chief Complaint  Patient presents with   Medical Management of Chronic Issues    Fasting, discuss cholesterol and prediabetes labs    HPI  Discussed the use of AI scribe software for clinical note transcription with the patient, who gave verbal consent to proceed.  History of Present Illness Kristine Bradshaw is a 53 year old female who presents for follow-up.  Hypertension - Blood pressure remains stable on hydrochlorothiazide , amlodipine  and losartan . - No chest pain, shortness of breath, dizziness, or syncope.  Hyperlipidemia - LDL cholesterol is slightly elevated at 111 mg/dL. - Currently treated with atorvastatin  10 mg  Electrolyte disturbance - Completed a five-day course of potassium supplements for hypokalemia.   Vitamin D  def - Mistakenly took high-dose vitamin D  supplement daily instead of weekly, completing six doses.  Prediabetes and dietary habits - A1c is 5.9%. - Frequently consumes bread and juice.  Anxiety  - Experiences anxiety and notices increased pulse with her anxiety only  - Manages symptoms by resting and closing her eyes. - No dizziness, chest pain, shortness of breath, nausea, vomiting, or diarrhea. -interested in starting medication   Denies depression. Prescribed Wellbutrin in 2023 but never took it.       Health Maintenance Due  Topic Date Due   HIV Screening  Never done   Hepatitis C Screening  Never done   Hepatitis B Vaccines (1 of 3 - 19+ 3-dose series) 02/15/1991   Cervical Cancer Screening (HPV/Pap Cotest)  Never done   Colonoscopy  Never done   Lung Cancer Screening  Never done   MAMMOGRAM  Never done   Zoster Vaccines- Shingrix (1 of 2) Never done   INFLUENZA VACCINE  03/22/2024    Past Medical History:  Diagnosis Date   High cholesterol    Hypertension     History reviewed. No pertinent surgical history.  History  reviewed. No pertinent family history.  Social History   Socioeconomic History   Marital status: Single    Spouse name: Not on file   Number of children: Not on file   Years of education: Not on file   Highest education level: Not on file  Occupational History   Not on file  Tobacco Use   Smoking status: Every Day   Smokeless tobacco: Not on file  Vaping Use   Vaping status: Never Used  Substance and Sexual Activity   Alcohol use: Yes    Comment: occ   Drug use: Never   Sexual activity: Not on file  Other Topics Concern   Not on file  Social History Narrative   Not on file   Social Drivers of Health   Financial Resource Strain: Not on file  Food Insecurity: Not on file  Transportation Needs: Not on file  Physical Activity: Not on file  Stress: Not on file  Social Connections: Not on file  Intimate Partner Violence: Low Risk  (08/27/2020)   Received from [facility]   Intimate Partner Violence    Insults You: Not on file    Threatens You: Not on file    Screams at You: Not on file    Physically Hurt: Not on file    Intimate Partner Violence Score: Not on file    Outpatient Medications Prior to Visit  Medication Sig Dispense Refill   amLODipine  (NORVASC ) 10 MG tablet Take 1 tablet (10 mg  total) by mouth daily. 90 tablet 0   atorvastatin  (LIPITOR) 10 MG tablet Take 1 tablet (10 mg total) by mouth at bedtime. 90 tablet 0   cyclobenzaprine (FLEXERIL) 10 MG tablet Take 10 mg by mouth at bedtime as needed.     losartan  (COZAAR ) 50 MG tablet Take 1 tablet (50 mg total) by mouth daily. 90 tablet 0   MIRALAX 17 GM/SCOOP powder SMARTSIG:1 scoopful By Mouth Every Night PRN     naproxen  (NAPROSYN ) 500 MG tablet Take 1 tablet (500 mg total) by mouth daily as needed. 30 tablet 0   potassium chloride  SA (KLOR-CON  M) 20 MEQ tablet Take 1 tablet (20 mEq total) by mouth daily. 5 tablet 0   SUMAtriptan (IMITREX) 50 MG tablet Take by mouth.     Vitamin D , Ergocalciferol , (DRISDOL )  1.25 MG (50000 UNIT) CAPS capsule Take 1 capsule (50,000 Units total) by mouth every 7 (seven) days. 6 capsule 1   buPROPion ER (WELLBUTRIN SR) 100 MG 12 hr tablet Take by mouth.     CLARITIN  10 MG tablet Take 10 mg by mouth daily.     hydrochlorothiazide  (HYDRODIURIL ) 25 MG tablet Take 1 tablet (25 mg total) by mouth daily. 90 tablet 0   No facility-administered medications prior to visit.    No Known Allergies  ROS Per HPI    Objective:    Physical Exam Constitutional:      General: She is not in acute distress.    Appearance: She is not ill-appearing.  Eyes:     Extraocular Movements: Extraocular movements intact.     Conjunctiva/sclera: Conjunctivae normal.  Cardiovascular:     Rate and Rhythm: Normal rate and regular rhythm.  Pulmonary:     Effort: Pulmonary effort is normal.  Musculoskeletal:     Cervical back: Normal range of motion and neck supple.     Right lower leg: No edema.     Left lower leg: No edema.  Skin:    General: Skin is warm and dry.  Neurological:     General: No focal deficit present.     Mental Status: She is alert and oriented to person, place, and time.     Cranial Nerves: No cranial nerve deficit.     Motor: No weakness.     Coordination: Coordination normal.     Gait: Gait normal.  Psychiatric:        Mood and Affect: Mood normal.        Behavior: Behavior normal.        Thought Content: Thought content normal.      BP 110/70   Pulse 65   Temp 97.8 F (36.6 C) (Temporal)   Ht 5' 10 (1.778 m)   Wt 236 lb (107 kg)   SpO2 97%   BMI 33.86 kg/m  Wt Readings from Last 3 Encounters:  03/27/24 236 lb (107 kg)  03/14/24 236 lb (107 kg)  12/23/21 213 lb (96.6 kg)        Assessment & Plan:   Problem List Items Addressed This Visit     Abnormal mammogram   Relevant Orders   Ambulatory referral to Gynecology   Cervical dysplasia   Relevant Orders   Ambulatory referral to Gynecology   Primary hypertension   Relevant Orders    Basic metabolic panel with GFR (Completed)   Vitamin D  deficiency   Relevant Orders   VITAMIN D  25 Hydroxy (Vit-D Deficiency, Fractures) (Completed)   Other Visit Diagnoses  Diuretic-induced hypokalemia    -  Primary   Relevant Orders   Basic metabolic panel with GFR (Completed)     Obesity (BMI 30.0-34.9)         Anxiety       Relevant Medications   sertraline  (ZOLOFT ) 25 MG tablet     Prediabetes         Smoker          Assessment and Plan Assessment & Plan Primary hypertension Blood pressure is too well-controlled on current medication regimen.  - Recheck potassium levels today. Hypokalemia on HCTZ - stop hydrochlorothiazide  and follow up in 3 months or sooner if BP is elevated.   Hyperlipidemia LDL cholesterol is slightly elevated at 111 mg/dL. Current atorvastatin  dose is maintained. Emphasis on dietary modifications to improve cholesterol levels. - Provide handout on cholesterol-lowering diet. - Recheck cholesterol levels in three months.  Hypokalemia related to diuretic therapy Potassium levels are low, likely due to hydrochlorothiazide  use. Potassium supplementation has been initiated. - Recheck potassium levels today. - Consider daily potassium supplementation if levels remain low.  Vitamin D  deficiency Vitamin D  levels were very low. She mistakenly took high-dose vitamin D  daily instead of weekly. - Recheck vitamin D  levels today. - Recheck calcium  and kidney function today.  Prediabetes A1c is 5.9%, indicating early prediabetes. Emphasis on dietary modifications to prevent progression to diabetes. - Provide handout on prediabetes eating plan. - Recheck A1c in three months.  Generalized anxiety disorder Reports episodes of anxiety with heart racing, no associated depression. Has not been taking previously prescribed Wellbutrin. New prescription for sertraline  to manage anxiety. - Prescribe sertraline  to be taken at bedtime. - Advise to avoid alcohol  and marijuana (if she uses) especially during the first four weeks of sertraline  use.    I have discontinued Annmarie Tollett's buPROPion ER. I have also changed her Claritin . Additionally, I am having her start on sertraline . Lastly, I am having her maintain her cyclobenzaprine, MiraLax, SUMAtriptan, losartan , amLODipine , naproxen , atorvastatin , Vitamin D  (Ergocalciferol ), and potassium chloride  SA.  Meds ordered this encounter  Medications   CLARITIN  10 MG tablet    Sig: Take 1 tablet (10 mg total) by mouth daily.    Dispense:  90 tablet    Refill:  1   sertraline  (ZOLOFT ) 25 MG tablet    Sig: Take 1 tablet (25 mg total) by mouth daily.    Dispense:  30 tablet    Refill:  2    Supervising Provider:   ROLLENE NORRIS A [[id number]]

## 2024-03-27 NOTE — Patient Instructions (Signed)
 Please go downstairs for labs before you leave.  I will be in touch with your results and with recommendations.  Start taking sertraline  in the evening.  Take this every day.  It takes approximately 2 to 4 weeks to know how you are going to feel on this medicine.  I will see you back in 3 months fasting

## 2024-03-28 ENCOUNTER — Other Ambulatory Visit: Payer: Self-pay | Admitting: Family Medicine

## 2024-04-06 ENCOUNTER — Other Ambulatory Visit: Payer: Self-pay | Admitting: Family Medicine

## 2024-04-09 ENCOUNTER — Telehealth: Payer: Self-pay

## 2024-04-09 NOTE — Telephone Encounter (Signed)
 Called pt and reiterated that Vickie stopped the medication at her last visit as her BP was too well controlled and she was also spilling out potassium. Told pt she is supposed to be keeping up and checking on her BP for 3 months and f/u w Vickie w her readings so we can reevaluate if pt needs to go back on it. Pt states she wasn't feeling well yesterday and thought it was due to not being on the hydrochlorothiazide , asked pt if she checked her BP as this is a BP med and pt said she didn't but is feeling better today. Advised pt to monitor BP and let us  know if it gets high and stays high. Pt stated understanding

## 2024-04-09 NOTE — Telephone Encounter (Signed)
 Copied from CRM [id number]. Topic: Clinical - Medication Question >> Apr 09, 2024 11:05 AM Berneda FALCON wrote: Reason for CRM: Patient wanted to know why Lendia wanted her to stop taking hydrochlorothiazide  (HYDRODIURIL ) 25 MG tablet. She states that she needs this medication and took it this morning because it helps her.   Can someone please call her back and help her understand everything?  Patient callback is [phone]

## 2024-04-12 ENCOUNTER — Other Ambulatory Visit: Payer: Self-pay | Admitting: Family Medicine

## 2024-04-15 ENCOUNTER — Other Ambulatory Visit: Payer: Self-pay | Admitting: Family Medicine

## 2024-04-15 ENCOUNTER — Ambulatory Visit (INDEPENDENT_AMBULATORY_CARE_PROVIDER_SITE_OTHER): Admitting: Primary Care

## 2024-04-21 ENCOUNTER — Other Ambulatory Visit: Payer: Self-pay | Admitting: Family Medicine

## 2024-04-23 ENCOUNTER — Other Ambulatory Visit: Payer: Self-pay | Admitting: Family Medicine

## 2024-04-25 ENCOUNTER — Other Ambulatory Visit: Payer: Self-pay | Admitting: Family Medicine

## 2024-05-01 ENCOUNTER — Other Ambulatory Visit: Payer: Self-pay | Admitting: Family Medicine

## 2024-05-03 ENCOUNTER — Other Ambulatory Visit: Payer: Self-pay | Admitting: Family Medicine

## 2024-05-06 ENCOUNTER — Other Ambulatory Visit: Payer: Self-pay

## 2024-05-07 ENCOUNTER — Encounter: Admitting: Obstetrics and Gynecology

## 2024-05-13 ENCOUNTER — Other Ambulatory Visit: Payer: Self-pay | Admitting: Family Medicine

## 2024-05-16 ENCOUNTER — Other Ambulatory Visit: Payer: Self-pay | Admitting: Family Medicine

## 2024-05-23 ENCOUNTER — Other Ambulatory Visit: Payer: Self-pay | Admitting: Family Medicine

## 2024-05-29 ENCOUNTER — Other Ambulatory Visit: Payer: Self-pay | Admitting: Family Medicine

## 2024-06-11 ENCOUNTER — Ambulatory Visit: Admitting: Family Medicine

## 2024-06-12 DIAGNOSIS — L089 Local infection of the skin and subcutaneous tissue, unspecified: Secondary | ICD-10-CM | POA: Diagnosis not present

## 2024-06-12 DIAGNOSIS — B9689 Other specified bacterial agents as the cause of diseases classified elsewhere: Secondary | ICD-10-CM | POA: Diagnosis not present

## 2024-06-12 DIAGNOSIS — W57XXXA Bitten or stung by nonvenomous insect and other nonvenomous arthropods, initial encounter: Secondary | ICD-10-CM | POA: Diagnosis not present

## 2024-06-12 DIAGNOSIS — T7840XA Allergy, unspecified, initial encounter: Secondary | ICD-10-CM | POA: Diagnosis not present

## 2024-06-12 DIAGNOSIS — S40261A Insect bite (nonvenomous) of right shoulder, initial encounter: Secondary | ICD-10-CM | POA: Diagnosis not present

## 2024-06-19 ENCOUNTER — Encounter: Payer: Self-pay | Admitting: Family Medicine

## 2024-06-19 ENCOUNTER — Ambulatory Visit: Admitting: Family Medicine

## 2024-06-19 ENCOUNTER — Other Ambulatory Visit: Payer: Self-pay | Admitting: Family Medicine

## 2024-06-19 ENCOUNTER — Other Ambulatory Visit

## 2024-06-19 VITALS — BP 98/62 | HR 73 | Temp 97.7°F | Ht 70.0 in | Wt 225.0 lb

## 2024-06-19 DIAGNOSIS — E785 Hyperlipidemia, unspecified: Secondary | ICD-10-CM

## 2024-06-19 DIAGNOSIS — R7303 Prediabetes: Secondary | ICD-10-CM

## 2024-06-19 DIAGNOSIS — E66811 Obesity, class 1: Secondary | ICD-10-CM | POA: Diagnosis not present

## 2024-06-19 DIAGNOSIS — E559 Vitamin D deficiency, unspecified: Secondary | ICD-10-CM

## 2024-06-19 DIAGNOSIS — T502X5A Adverse effect of carbonic-anhydrase inhibitors, benzothiadiazides and other diuretics, initial encounter: Secondary | ICD-10-CM | POA: Diagnosis not present

## 2024-06-19 DIAGNOSIS — R55 Syncope and collapse: Secondary | ICD-10-CM | POA: Diagnosis not present

## 2024-06-19 DIAGNOSIS — R42 Dizziness and giddiness: Secondary | ICD-10-CM

## 2024-06-19 DIAGNOSIS — I10 Essential (primary) hypertension: Secondary | ICD-10-CM

## 2024-06-19 DIAGNOSIS — E876 Hypokalemia: Secondary | ICD-10-CM

## 2024-06-19 DIAGNOSIS — Z23 Encounter for immunization: Secondary | ICD-10-CM

## 2024-06-19 LAB — LIPID PANEL
Cholesterol: 210 mg/dL — ABNORMAL HIGH (ref 0–200)
HDL: 68.3 mg/dL (ref >39.00)
LDL Cholesterol: 99 mg/dL (ref 0–99)
NonHDL: 141.33
Total CHOL/HDL Ratio: 3
Triglycerides: 212 mg/dL — ABNORMAL HIGH (ref 0.0–149.0)
VLDL: 42.4 mg/dL — ABNORMAL HIGH (ref 0.0–40.0)

## 2024-06-19 LAB — CBC WITH DIFFERENTIAL/PLATELET
Basophils Absolute: 0.1 K/uL (ref 0.0–0.1)
Basophils Relative: 1 % (ref 0.0–3.0)
Eosinophils Absolute: 0.1 K/uL (ref 0.0–0.7)
Eosinophils Relative: 1.9 % (ref 0.0–5.0)
HCT: 41.9 % (ref 36.0–46.0)
Hemoglobin: 14 g/dL (ref 12.0–15.0)
Lymphocytes Relative: 36.7 % (ref 12.0–46.0)
Lymphs Abs: 2.2 K/uL (ref 0.7–4.0)
MCHC: 33.3 g/dL (ref 30.0–36.0)
MCV: 92.4 fl (ref 78.0–100.0)
Monocytes Absolute: 0.6 K/uL (ref 0.1–1.0)
Monocytes Relative: 9.6 % (ref 3.0–12.0)
Neutro Abs: 3.1 K/uL (ref 1.4–7.7)
Neutrophils Relative %: 50.8 % (ref 43.0–77.0)
Platelets: 251 K/uL (ref 150.0–400.0)
RBC: 4.53 Mil/uL (ref 3.87–5.11)
RDW: 14 % (ref 11.5–15.5)
WBC: 6.1 K/uL (ref 4.0–10.5)

## 2024-06-19 LAB — COMPREHENSIVE METABOLIC PANEL WITH GFR
ALT: 13 U/L (ref 0–35)
AST: 15 U/L (ref 0–37)
Albumin: 4.6 g/dL (ref 3.5–5.2)
Alkaline Phosphatase: 77 U/L (ref 39–117)
BUN: 12 mg/dL (ref 6–23)
CO2: 28 meq/L (ref 19–32)
Calcium: 9.9 mg/dL (ref 8.4–10.5)
Chloride: 103 meq/L (ref 96–112)
Creatinine, Ser: 0.75 mg/dL (ref 0.40–1.20)
GFR: 91.59 mL/min (ref >60.00)
Glucose, Bld: 96 mg/dL (ref 70–99)
Potassium: 3.8 meq/L (ref 3.5–5.1)
Sodium: 140 meq/L (ref 135–145)
Total Bilirubin: 0.3 mg/dL (ref 0.2–1.2)
Total Protein: 7.9 g/dL (ref 6.0–8.3)

## 2024-06-19 LAB — MAGNESIUM: Magnesium: 2.2 mg/dL (ref 1.5–2.5)

## 2024-06-19 LAB — TSH: TSH: 2.6 u[IU]/mL (ref 0.35–5.50)

## 2024-06-19 MED ORDER — ATORVASTATIN CALCIUM 10 MG PO TABS: 10.0000 mg | ORAL_TABLET | Freq: Every day | ORAL | 0 refills | Status: AC

## 2024-06-19 MED ORDER — AMLODIPINE BESYLATE 5 MG PO TABS: 5.0000 mg | ORAL_TABLET | Freq: Every day | ORAL | 0 refills | Status: AC

## 2024-06-19 MED ORDER — LOSARTAN POTASSIUM 25 MG PO TABS: 25.0000 mg | ORAL_TABLET | Freq: Every day | ORAL | 1 refills | Status: AC

## 2024-06-19 MED ORDER — HYDROCHLOROTHIAZIDE 12.5 MG PO CAPS: 12.5000 mg | ORAL_CAPSULE | Freq: Every day | ORAL | 1 refills | Status: DC

## 2024-06-19 MED ORDER — SERTRALINE HCL 25 MG PO TABS: 25.0000 mg | ORAL_TABLET | Freq: Every day | ORAL | 1 refills | Status: AC

## 2024-06-19 NOTE — Patient Instructions (Addendum)
 Please go to [address] [facility], [place] for labs now.   I am reducing your losartan  dose and your hydrochlorothiazide  dose.   Monitor your blood pressure at home and let me know if you have any low readings and feel dizzy or pass out. You will need to call 911 or go to the emergency department if you pass out.   I am ordering a CT scan of your head and you will be called to get this done.   I will be in touch with your lab results.

## 2024-06-19 NOTE — Progress Notes (Signed)
 "  Subjective:     Patient ID: Kristine Bradshaw, female    DOB: 1971-02-21, 53 y.o.   MRN: [id number]  Chief Complaint  Patient presents with   Follow-up    BP f/u, did stop the hydrochlorothiazide  and was dizzy so she started back and felt better. Wants refill of it  Apartment complex has roaches and they have bitten her, wants looked at, pain was shooting up her neck. Went to UC and they gave her abx ointment, but pain is still there in shoulder    HPI  Discussed the use of AI scribe software for clinical note transcription with the patient, who gave verbal consent to proceed.  History of Present Illness Kristine Bradshaw is a 53 year old female with hypertension, prediabetes, and obesity who presents for follow-up of chronic health conditions.  Hypertension and associated symptoms - Hypertension managed with hydrochlorothiazide  - Dizziness and near syncope occurred after discontinuing hydrochlorothiazide , resolved upon resuming medication - Occasional palpitations, more frequent when not taking hydrochlorothiazide  - No chest pain, shortness of breath, or headaches  Prediabetes and obesity - Prediabetes and obesity under ongoing management - No acute symptoms related to hyperglycemia or hypoglycemia reported  Insect bite - Recent insect bite on shoulder with initial sharp pain and pruritus - Symptoms improved with antibiotic and ointment treatment - Another individual in her apartment also experienced a similar insect bite     Health Maintenance Due  Topic Date Due   HIV Screening  Never done   Hepatitis C Screening  Never done   Hepatitis B Vaccines 19-59 Average Risk (1 of 3 - 19+ 3-dose series) 02/15/1991   Mammogram  Never done   Colonoscopy  Never done   Lung Cancer Screening  Never done   Zoster Vaccines- Shingrix (1 of 2) Never done    Past Medical History:  Diagnosis Date   High cholesterol    Hypertension     History reviewed. No pertinent surgical  history.  History reviewed. No pertinent family history.  Social History   Socioeconomic History   Marital status: Single    Spouse name: Not on file   Number of children: Not on file   Years of education: Not on file   Highest education level: Not on file  Occupational History   Not on file  Tobacco Use   Smoking status: Every Day   Smokeless tobacco: Not on file  Vaping Use   Vaping status: Never Used  Substance and Sexual Activity   Alcohol use: Yes    Comment: occ   Drug use: Never   Sexual activity: Not on file  Other Topics Concern   Not on file  Social History Narrative   Not on file   Social Drivers of Health   Financial Resource Strain: Not on file  Food Insecurity: Not on file  Transportation Needs: Not on file  Physical Activity: Not on file  Stress: Not on file  Social Connections: Not on file  Intimate Partner Violence: Low Risk  (08/27/2020)   Received from [facility]   Intimate Partner Violence    Insults You: Not on file    Threatens You: Not on file    Screams at [place]: Not on file    Physically Hurt: Not on file    Intimate Partner Violence Score: Not on file    Outpatient Medications Prior to Visit  Medication Sig Dispense Refill   CLARITIN  10 MG tablet Take 1 tablet (10 mg total) by mouth  daily. 90 tablet 1   cyclobenzaprine (FLEXERIL) 10 MG tablet Take 10 mg by mouth at bedtime as needed.     MIRALAX 17 GM/SCOOP powder SMARTSIG:1 scoopful By Mouth Every Night PRN     naproxen  (NAPROSYN ) 500 MG tablet TAKE 1 TABLET BY MOUTH DAILY AS NEEDED. 30 tablet 2   potassium chloride  SA (KLOR-CON  M) 20 MEQ tablet Take 1 tablet (20 mEq total) by mouth daily. 5 tablet 0   SUMAtriptan (IMITREX) 50 MG tablet Take by mouth.     Vitamin D , Ergocalciferol , (DRISDOL ) 1.25 MG (50000 UNIT) CAPS capsule Take 1 capsule (50,000 Units total) by mouth every 7 (seven) days. 6 capsule 1   amLODipine  (NORVASC ) 10 MG tablet TAKE 1 TABLET BY MOUTH EVERY DAY 90 tablet 0    atorvastatin  (LIPITOR) 10 MG tablet TAKE 1 TABLET BY MOUTH EVERYDAY AT BEDTIME 90 tablet 0   losartan  (COZAAR ) 50 MG tablet TAKE 1 TABLET BY MOUTH EVERY DAY 90 tablet 0   sertraline  (ZOLOFT ) 25 MG tablet TAKE 1 TABLET (25 MG TOTAL) BY MOUTH DAILY. 90 tablet 1   No facility-administered medications prior to visit.    No Known Allergies  Review of Systems  Constitutional:  Negative for chills, fever and malaise/fatigue.  Eyes:  Negative for blurred vision, double vision and photophobia.  Respiratory:  Negative for shortness of breath.   Cardiovascular:  Negative for chest pain, palpitations and leg swelling.  Gastrointestinal:  Negative for abdominal pain, constipation, diarrhea, nausea and vomiting.  Genitourinary:  Negative for dysuria, frequency and urgency.  Musculoskeletal:  Negative for falls.  Skin:  Positive for rash. Negative for itching.       Healing from insect bites   Neurological:  Positive for dizziness. Negative for tingling, focal weakness and headaches.       Near syncope  Psychiatric/Behavioral:  The patient is nervous/anxious.        Objective:    Physical Exam Constitutional:      General: She is not in acute distress.    Appearance: She is not ill-appearing.  HENT:     Right Ear: Tympanic membrane and ear canal normal.     Left Ear: Tympanic membrane and ear canal normal.     Nose: Nose normal.     Mouth/Throat:     Mouth: Mucous membranes are moist.     Pharynx: Oropharynx is clear.  Eyes:     General: No visual field deficit.    Extraocular Movements: Extraocular movements intact.     Conjunctiva/sclera: Conjunctivae normal.     Pupils: Pupils are equal, round, and reactive to light.  Cardiovascular:     Rate and Rhythm: Normal rate and regular rhythm.  Pulmonary:     Effort: Pulmonary effort is normal.     Breath sounds: Normal breath sounds.  Musculoskeletal:        General: Normal range of motion.     Cervical back: Normal range of motion and  neck supple. No tenderness.     Right lower leg: No edema.     Left lower leg: No edema.  Lymphadenopathy:     Cervical: No cervical adenopathy.  Skin:    General: Skin is warm and dry.     Findings: Rash present.     Comments: Healing rash on right shoulder from previous insect bites. No sign of infection   Neurological:     General: No focal deficit present.     Mental Status: She is alert and oriented  to person, place, and time.     Cranial Nerves: No cranial nerve deficit or facial asymmetry.     Sensory: No sensory deficit.     Motor: No weakness or abnormal muscle tone.     Coordination: Romberg sign negative. Coordination normal.     Gait: Gait normal.  Psychiatric:        Mood and Affect: Mood normal.        Behavior: Behavior normal.        Thought Content: Thought content normal.      BP 98/62   Pulse 73   Temp 97.7 F (36.5 C) (Temporal)   Ht 5' 10 (1.778 m)   Wt 225 lb (102.1 kg)   SpO2 98%   BMI 32.28 kg/m  Wt Readings from Last 3 Encounters:  06/19/24 225 lb (102.1 kg)  03/27/24 236 lb (107 kg)  03/14/24 236 lb (107 kg)       Assessment & Plan:   Problem List Items Addressed This Visit     Primary hypertension   Relevant Medications   losartan  (COZAAR ) 25 MG tablet   hydrochlorothiazide  (MICROZIDE ) 12.5 MG capsule   amLODipine  (NORVASC ) 5 MG tablet   atorvastatin  (LIPITOR) 10 MG tablet   Other Relevant Orders   CBC with Differential/Platelet (Completed)   Comprehensive metabolic panel with GFR (Completed)   TSH (Completed)   Vitamin D  deficiency   Other Visit Diagnoses       Near syncope    -  Primary   Relevant Medications   losartan  (COZAAR ) 25 MG tablet   hydrochlorothiazide  (MICROZIDE ) 12.5 MG capsule   amLODipine  (NORVASC ) 5 MG tablet   atorvastatin  (LIPITOR) 10 MG tablet   Other Relevant Orders   CT HEAD WO CONTRAST ( )   CBC with Differential/Platelet (Completed)   Comprehensive metabolic panel with GFR (Completed)   TSH  (Completed)   Magnesium (Completed)     Diuretic-induced hypokalemia       Relevant Orders   Comprehensive metabolic panel with GFR (Completed)     Immunization due         Prediabetes       Relevant Orders   CBC with Differential/Platelet (Completed)   Comprehensive metabolic panel with GFR (Completed)   Hemoglobin A1c (Completed)     Dizziness       Relevant Orders   CT HEAD WO CONTRAST ( )   CBC with Differential/Platelet (Completed)   Comprehensive metabolic panel with GFR (Completed)   TSH (Completed)   Magnesium (Completed)     Obesity (BMI 30.0-34.9)       Relevant Orders   CBC with Differential/Platelet (Completed)   Comprehensive metabolic panel with GFR (Completed)   Hemoglobin A1c (Completed)   Lipid panel (Completed)     Hyperlipidemia, unspecified hyperlipidemia type       Relevant Medications   losartan  (COZAAR ) 25 MG tablet   hydrochlorothiazide  (MICROZIDE ) 12.5 MG capsule   amLODipine  (NORVASC ) 5 MG tablet   atorvastatin  (LIPITOR) 10 MG tablet   Other Relevant Orders   Lipid panel (Completed)       Assessment and Plan Assessment & Plan Hypertension Blood pressure is lower today. Current medication regimen may contribute to dizziness and near syncope. - Reduce losartan  dose by half - Reduce hydrochlorothiazide  dose by half -Reduce amlodipine  to 5 mg daily. - Check potassium level today - Closely monitor blood pressure at home.  Dizziness and near syncope Dizziness and near syncope occurred after stopping hydrochlorothiazide  in July, resolved after  resuming.  She is adamant that she wants to take hydrochlorothiazide  and does not want me to stop it.  I will reduce losartan , amlodipine  and HCTZ doses to give her more blood pressure. - Order CT head - Perform blood work - Advise emergency room visit if syncope recurs  Insect bites, resolving Insect bites from a week ago are resolving after treatment with antibiotics and  ointment.  Prediabetes -Reduce sugar and carbohydrates in diet and increase activity to lower blood sugars.  Obesity - Work on healthy diet and exercise to lose weight       I have discontinued Tongela Lipson's amLODipine  and losartan . I have also changed her atorvastatin . Additionally, I am having her start on losartan , hydrochlorothiazide , and amLODipine . Lastly, I am having her maintain her cyclobenzaprine, MiraLax, SUMAtriptan, Vitamin D  (Ergocalciferol ), potassium chloride  SA, Claritin , naproxen , and sertraline .  Meds ordered this encounter  Medications   losartan  (COZAAR ) 25 MG tablet    Sig: Take 1 tablet (25 mg total) by mouth daily.    Dispense:  90 tablet    Refill:  1    Supervising Provider:   ROLLENE NORRIS A [[id number]]   hydrochlorothiazide  (MICROZIDE ) 12.5 MG capsule    Sig: Take 1 capsule (12.5 mg total) by mouth daily.    Dispense:  30 capsule    Refill:  1    Supervising Provider:   ROLLENE NORRIS A [[id number]]   amLODipine  (NORVASC ) 5 MG tablet    Sig: Take 1 tablet (5 mg total) by mouth daily.    Dispense:  90 tablet    Refill:  0    Supervising Provider:   ROLLENE NORRIS A [[id number]]   atorvastatin  (LIPITOR) 10 MG tablet    Sig: Take 1 tablet (10 mg total) by mouth daily.    Dispense:  90 tablet    Refill:  0    Supervising Provider:   ROLLENE NORRIS A [[id number]]   sertraline  (ZOLOFT ) 25 MG tablet    Sig: Take 1 tablet (25 mg total) by mouth daily.    Dispense:  90 tablet    Refill:  1    Supervising Provider:   ROLLENE NORRIS A [[id number]]    "

## 2024-06-20 ENCOUNTER — Ambulatory Visit: Payer: Self-pay | Admitting: Family Medicine

## 2024-06-20 LAB — HEMOGLOBIN A1C: Hgb A1c MFr Bld: 5.8 % (ref 4.6–6.5)

## 2024-06-24 ENCOUNTER — Other Ambulatory Visit

## 2024-07-13 ENCOUNTER — Other Ambulatory Visit: Payer: Self-pay | Admitting: Family Medicine

## 2024-07-31 ENCOUNTER — Ambulatory Visit: Payer: Self-pay

## 2024-07-31 ENCOUNTER — Other Ambulatory Visit: Payer: Self-pay | Admitting: Family Medicine

## 2024-07-31 MED ORDER — HYDROCHLOROTHIAZIDE 12.5 MG PO CAPS: ORAL_CAPSULE | ORAL | 0 refills | Status: AC

## 2024-07-31 NOTE — Telephone Encounter (Signed)
 Noted, pt can let us  know if pharmacy does not have

## 2024-07-31 NOTE — Telephone Encounter (Signed)
 Copied from CRM #[id number]. Topic: Clinical - Medication Refill >> Jul 31, 2024  8:44 AM Viola F wrote: Medication: hydrochlorothiazide  (MICROZIDE ) 12.5 MG capsule [[id number]]   Has the patient contacted their pharmacy? Yes (Agent: If no, request that the patient contact the pharmacy for the refill. If patient does not wish to contact the pharmacy document the reason why and proceed with request.) (Agent: If yes, when and what did the pharmacy advise?)  This is the patient's preferred pharmacy:  [id number] [place], [place] - [address] [place] [address] Phone: [phone] Fax: [phone]  Is this the correct pharmacy for this prescription? Yes If no, delete pharmacy and type the correct one.   Has the prescription been filled recently? Yes  Is the patient out of the medication? Yes, she has been out for 3 days and having dizziness, transferred to [facility]   Has the patient been seen for an appointment in the last year OR does the patient have an upcoming appointment? Yes  Can we respond through MyChart? Yes  Agent: Please be advised that Rx refills may take up to 3 business days. We ask that you follow-up with your pharmacy.

## 2024-07-31 NOTE — Telephone Encounter (Signed)
 Medication was filled for 90 days 2 weeks ago to pharmacy listed. LM for pt to contact pharmacy in regards to this

## 2024-07-31 NOTE — Telephone Encounter (Signed)
 FYI Only or Action Required?: FYI only for provider: call pharmacy.  Patient was last seen in primary care on 06/19/2024 by Lendia Boby CROME, NP-C.  Called Nurse Triage reporting Dizziness.  Symptoms began several days ago.  Interventions attempted: Nothing.  Symptoms are: stable.  Triage Disposition: Information or Advice Only Call  Patient/caregiver understands and will follow disposition?: Yes Reason for Disposition  General information question, no triage required and triager able to answer question  Answer Assessment - Initial Assessment Questions Advised patient her medication was sent to [facility] in [place] on 07/15/24, 90 capsules she needs to call her pharmacy. Patient verbalized understanding.   1. REASON FOR CALL: What is the main reason for your call? or How can I best help you?     Patient calling saying she has been out of her hydrochlorothiazide  (MICROZIDE ) 12.5 MG capsule. Patient stated someone stopped that medication.  2. SYMPTOMS : Do you have any symptoms?      Dizziness  Protocols used: Information Only Call - No Triage-A-AH  Copied from CRM [id number]. Topic: Clinical - Red Word Triage >> Jul 31, 2024  8:42 AM Viola FALCON wrote: Red Word that prompted transfer to Nurse Triage: Patient having dizziness, she has been out of blood pressure medication for 3 days

## 2024-08-15 ENCOUNTER — Emergency Department (HOSPITAL_COMMUNITY)
Admission: EM | Admit: 2024-08-15 | Discharge: 2024-08-15 | Discharge status: Against Medical Advice | Attending: Emergency Medicine | Admitting: Emergency Medicine

## 2024-08-15 ENCOUNTER — Encounter (HOSPITAL_COMMUNITY): Payer: Self-pay

## 2024-08-15 ENCOUNTER — Other Ambulatory Visit: Payer: Self-pay

## 2024-08-15 DIAGNOSIS — Z5321 Procedure and treatment not carried out due to patient leaving prior to being seen by health care provider: Secondary | ICD-10-CM | POA: Insufficient documentation

## 2024-08-15 DIAGNOSIS — R059 Cough, unspecified: Secondary | ICD-10-CM | POA: Insufficient documentation

## 2024-08-15 DIAGNOSIS — B974 Respiratory syncytial virus as the cause of diseases classified elsewhere: Secondary | ICD-10-CM | POA: Diagnosis not present

## 2024-08-15 LAB — RESP PANEL BY RT-PCR (RSV, FLU A&B, COVID)  RVPGX2
Influenza A by PCR: NEGATIVE
Influenza B by PCR: NEGATIVE
Resp Syncytial Virus by PCR: POSITIVE — AB
SARS Coronavirus 2 by RT PCR: NEGATIVE

## 2024-08-15 NOTE — ED Triage Notes (Signed)
 Pt to er, pt states that she is here for some chest congestions, fever, chills, body aches, and cough, states that she thinks that she has the flu.  Pt states that she has been feeling poorly for the past two days.

## 2024-08-15 NOTE — ED Notes (Signed)
 Pt left ED d/t wait time. Pt seen leaving ED.

## 2024-08-16 ENCOUNTER — Other Ambulatory Visit: Payer: Self-pay

## 2024-08-16 ENCOUNTER — Encounter (HOSPITAL_COMMUNITY): Payer: Self-pay

## 2024-08-16 ENCOUNTER — Emergency Department (HOSPITAL_COMMUNITY)
Admission: EM | Admit: 2024-08-16 | Discharge: 2024-08-16 | Disposition: A | Discharge status: Home / Self Care | Attending: Emergency Medicine | Admitting: Emergency Medicine

## 2024-08-16 DIAGNOSIS — R059 Cough, unspecified: Secondary | ICD-10-CM | POA: Insufficient documentation

## 2024-08-16 DIAGNOSIS — B974 Respiratory syncytial virus as the cause of diseases classified elsewhere: Secondary | ICD-10-CM | POA: Insufficient documentation

## 2024-08-16 DIAGNOSIS — R519 Headache, unspecified: Secondary | ICD-10-CM | POA: Diagnosis present

## 2024-08-16 DIAGNOSIS — B338 Other specified viral diseases: Secondary | ICD-10-CM

## 2024-08-16 MED ORDER — KETOROLAC TROMETHAMINE 30 MG/ML IJ SOLN
30.0000 mg | Freq: Once | INTRAMUSCULAR | Status: AC
  Administered 2024-08-16: 30 mg via INTRAMUSCULAR
  Filled 2024-08-16: qty 1

## 2024-08-16 MED ORDER — DEXAMETHASONE SOD PHOSPHATE PF 10 MG/ML IJ SOLN
10.0000 mg | Freq: Once | INTRAMUSCULAR | Status: AC
  Administered 2024-08-16: 10 mg via INTRAMUSCULAR

## 2024-08-16 NOTE — ED Provider Notes (Signed)
 " [place] [facility] Provider Note   CSN: [id number] Arrival date & time: 08/16/24  1032     Patient presents with: No chief complaint on file.   Kristine Bradshaw is a 53 y.o. female here with 3 days now of headache, cough, back pain, poor appetite.  Tested positive for RSV in ED two days ago but left due to long wait time.  No hx of lung problems.   HPI     Prior to Admission medications  Medication Sig Start Date End Date Taking? Authorizing Provider  amLODipine  (NORVASC ) 5 MG tablet Take 1 tablet (5 mg total) by mouth daily. 06/19/24   Henson, Vickie L, NP-C  atorvastatin  (LIPITOR) 10 MG tablet Take 1 tablet (10 mg total) by mouth daily. 06/19/24   Henson, Vickie L, NP-C  CLARITIN  10 MG tablet Take 1 tablet (10 mg total) by mouth daily. 03/27/24   Henson, Vickie L, NP-C  cyclobenzaprine (FLEXERIL) 10 MG tablet Take 10 mg by mouth at bedtime as needed. 12/21/21   [provider]  hydrochlorothiazide  (MICROZIDE ) 12.5 MG capsule TAKE 1 CAPSULE BY MOUTH EVERY DAY 07/31/24   Henson, Vickie L, NP-C  losartan  (COZAAR ) 25 MG tablet Take 1 tablet (25 mg total) by mouth daily. 06/19/24   Lendia Boby CROME, NP-C  MIRALAX 17 GM/SCOOP powder SMARTSIG:1 scoopful By Mouth Every Night PRN 11/03/21   [provider]  naproxen  (NAPROSYN ) 500 MG tablet TAKE 1 TABLET BY MOUTH DAILY AS NEEDED. 05/14/24   Henson, Vickie L, NP-C  potassium chloride  SA (KLOR-CON  M) 20 MEQ tablet Take 1 tablet (20 mEq total) by mouth daily. 03/19/24   Henson, Vickie L, NP-C  sertraline  (ZOLOFT ) 25 MG tablet Take 1 tablet (25 mg total) by mouth daily. 06/19/24   Henson, Vickie L, NP-C  SUMAtriptan (IMITREX) 50 MG tablet Take by mouth. 04/14/21   [provider]  Vitamin D , Ergocalciferol , (DRISDOL ) 1.25 MG (50000 UNIT) CAPS capsule Take 1 capsule (50,000 Units total) by mouth every 7 (seven) days. 03/19/24   Lendia Boby CROME, NP-C    Allergies: Patient has no known  allergies.    Review of Systems  Updated Vital Signs BP (!) 118/90   Pulse 84   Temp (!) 101.4 F (38.6 C) (Oral)   Resp (!) 21   SpO2 99%   Physical Exam Constitutional:      General: She is not in acute distress. HENT:     Head: Normocephalic and atraumatic.  Eyes:     Conjunctiva/sclera: Conjunctivae normal.     Pupils: Pupils are equal, round, and reactive to light.  Cardiovascular:     Rate and Rhythm: Normal rate and regular rhythm.  Pulmonary:     Effort: Pulmonary effort is normal. No respiratory distress.  Abdominal:     General: There is no distension.     Tenderness: There is no abdominal tenderness.  Skin:    General: Skin is warm and dry.  Neurological:     General: No focal deficit present.     Mental Status: She is alert. Mental status is at baseline.  Psychiatric:        Mood and Affect: Mood normal.        Behavior: Behavior normal.     (all labs ordered are listed, but only abnormal results are displayed) Labs Reviewed - No data to display  EKG: None  Radiology: No results found.   Procedures   Medications Ordered in the ED  ketorolac  (TORADOL ) 30 MG/ML injection 30 mg (has no administration in time range)  dexamethasone  (DECADRON ) injection 10 mg (has no administration in time range)                                    Medical Decision Making Risk Prescription drug management.   Pt here with suspected viral syndrome Febrile Known +RSV positive Discussed this is likely cause of symptoms IM decadron  and toradol  ordered No indication for xray or labs, no hypoxia, doubt sepsis or bacterial infection; no role for antibiotics  Discussed supportive care at home, staying hydrated     Final diagnoses:  Infection due to respiratory syncytial virus (RSV), unspecified infection type    ED Discharge Orders     None          Cottie Donnice PARAS, MD 08/16/24 1052  "

## 2024-08-16 NOTE — ED Triage Notes (Addendum)
 Pt bib ems form home; seen yesterday, diagnosed with RSV; ems reports pt left prior to being treated; this am pt woke with sob and cough; ems reports clear lung sounds; pt reports low back pain x 2 days; temp 100 F, Tylenol given pta; denies n/v; vss  Pt gives verbal consent for mse

## 2024-08-19 ENCOUNTER — Telehealth: Payer: Self-pay

## 2024-08-19 NOTE — Telephone Encounter (Signed)
 Received call from patient requesting resp test results from 12/25. Resp panel negative at that time.   Merilee Batty, MSN, RN Case Management [phone]

## 2024-09-05 ENCOUNTER — Other Ambulatory Visit: Payer: Self-pay | Admitting: Family Medicine
